# Patient Record
Sex: Female | Born: 1955 | Race: White | Hispanic: No | Marital: Single | Smoking: Never smoker
Health system: Southern US, Community
[De-identification: ages and names within clinical notes are randomized; demographics above are authoritative.]

## PROBLEM LIST (undated history)

## (undated) DIAGNOSIS — E785 Hyperlipidemia, unspecified: Secondary | ICD-10-CM

## (undated) DIAGNOSIS — I10 Essential (primary) hypertension: Secondary | ICD-10-CM

## (undated) DIAGNOSIS — Z8679 Personal history of other diseases of the circulatory system: Secondary | ICD-10-CM

## (undated) HISTORY — DX: Personal history of other diseases of the circulatory system: Z86.79

## (undated) HISTORY — DX: Hyperlipidemia, unspecified: E78.5

## (undated) HISTORY — DX: Essential (primary) hypertension: I10

---

## 1998-10-23 ENCOUNTER — Ambulatory Visit (HOSPITAL_COMMUNITY): Admission: RE | Admit: 1998-10-23 | Discharge: 1998-10-23 | Payer: Self-pay | Admitting: Family Medicine

## 2002-09-12 ENCOUNTER — Inpatient Hospital Stay (HOSPITAL_COMMUNITY): Admission: AD | Admit: 2002-09-12 | Discharge: 2002-09-12 | Payer: Self-pay | Admitting: Obstetrics and Gynecology

## 2004-01-24 ENCOUNTER — Encounter: Admission: RE | Admit: 2004-01-24 | Discharge: 2004-01-24 | Payer: Self-pay | Admitting: Obstetrics and Gynecology

## 2004-02-06 ENCOUNTER — Ambulatory Visit (HOSPITAL_COMMUNITY): Admission: RE | Admit: 2004-02-06 | Discharge: 2004-02-06 | Payer: Self-pay | Admitting: Obstetrics and Gynecology

## 2004-02-14 ENCOUNTER — Encounter: Admission: RE | Admit: 2004-02-14 | Discharge: 2004-02-14 | Payer: Self-pay | Admitting: Obstetrics and Gynecology

## 2004-02-21 ENCOUNTER — Encounter: Admission: RE | Admit: 2004-02-21 | Discharge: 2004-02-21 | Payer: Self-pay | Admitting: Obstetrics and Gynecology

## 2004-03-01 ENCOUNTER — Encounter: Admission: RE | Admit: 2004-03-01 | Discharge: 2004-03-01 | Payer: Self-pay | Admitting: Family Medicine

## 2004-03-13 ENCOUNTER — Encounter: Admission: RE | Admit: 2004-03-13 | Discharge: 2004-03-13 | Payer: Self-pay | Admitting: Obstetrics and Gynecology

## 2015-06-06 ENCOUNTER — Telehealth: Payer: Self-pay | Admitting: Cardiovascular Disease

## 2015-06-06 NOTE — Telephone Encounter (Signed)
Received records from [facility] for appointment on 07/07/15 with Dr Allyson SabalBerry.  [facility] for Dr Hazle CocaBerry's schedule on 07/07/15.

## 2015-07-07 ENCOUNTER — Encounter: Payer: Self-pay | Admitting: Cardiovascular Disease

## 2015-07-07 ENCOUNTER — Ambulatory Visit (INDEPENDENT_AMBULATORY_CARE_PROVIDER_SITE_OTHER): Payer: Self-pay | Admitting: Cardiovascular Disease

## 2015-07-07 VITALS — BP 144/92 | HR 64 | Ht 62.0 in | Wt 176.0 lb

## 2015-07-07 DIAGNOSIS — E785 Hyperlipidemia, unspecified: Secondary | ICD-10-CM | POA: Insufficient documentation

## 2015-07-07 DIAGNOSIS — R0989 Other specified symptoms and signs involving the circulatory and respiratory systems: Secondary | ICD-10-CM

## 2015-07-07 DIAGNOSIS — R011 Cardiac murmur, unspecified: Secondary | ICD-10-CM | POA: Insufficient documentation

## 2015-07-07 DIAGNOSIS — I10 Essential (primary) hypertension: Secondary | ICD-10-CM | POA: Insufficient documentation

## 2015-07-07 NOTE — Assessment & Plan Note (Signed)
History of cardiac murmur in the past. 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 We will check a 2-D echocardiogram

## 2015-07-07 NOTE — Progress Notes (Signed)
     07/07/2015 Nicole Mack   1956/03/09  [id number]  Primary Physician No primary care provider on file. Primary Cardiologist: Runell GessJonathan J. Eddy Termine MD Roseanne RenoFACP,FACC,FAHA, FSCAI   HPI:  Nicole Mack is a 59 year old moderately overweight single Caucasian female mother of 2, grandmother and 3 grandchildren who is referred by her primary care physician for evaluation of a murmur and hypertension. She has no prior cardiac history. Risk factors include treated hypertension, hyperlipidemia and family history. She does not smoke. He's never had a heart attack or stroke. She denies chest pain or shortness of breath. She does admit to dietary indiscretion.   Current Outpatient Prescriptions  Medication Sig Dispense Refill  . aspirin 81 MG EC tablet Take 81 mg by mouth every other day.     . carvedilol (COREG) 12.5 MG tablet Take 12.5 mg by mouth 2 (two) times daily. TAKE 1 AND 1/2 TABLET TWICE DAILY.    . hydrALAZINE (APRESOLINE) 25 MG tablet Take 25 mg by mouth 2 (two) times daily.    Marland Kitchen. lisinopril (PRINIVIL,ZESTRIL) 20 MG tablet Take 20 mg by mouth 2 (two) times daily.     No current facility-administered medications for this visit.    No Known Allergies  Social History   Social History  . Marital Status: Divorced    Spouse Name: N/A  . Number of Children: N/A  . Years of Education: N/A   Occupational History  . Not on file.   Social History Main Topics  . Smoking status: Never Smoker   . Smokeless tobacco: Never Used  . Alcohol Use: No  . Drug Use: Not on file  . Sexual Activity: Not on file   Other Topics Concern  . Not on file   Social History Narrative  . No narrative on file     Review of Systems: General: negative for chills, fever, night sweats or weight changes.  Cardiovascular: negative for chest pain, dyspnea on exertion, edema, orthopnea, palpitations, paroxysmal nocturnal dyspnea or shortness of breath Dermatological: negative for rash Respiratory: negative for cough or  wheezing Urologic: negative for hematuria Abdominal: negative for nausea, vomiting, diarrhea, bright red blood per rectum, melena, or hematemesis Neurologic: negative for visual changes, syncope, or dizziness All other systems reviewed and are otherwise negative except as noted above.    Blood pressure 144/92, pulse 64, height 5\' 2"  (1.575 m), weight 176 lb (79.833 kg).  General appearance: alert and no distress Neck: no adenopathy, no JVD, supple, symmetrical, trachea midline, thyroid not enlarged, symmetric, no tenderness/mass/nodules and soft right carotid bruit Lungs: clear to auscultation bilaterally Heart: 2/6 outflow tract murmur consistent with aortic sclerosis Extremities: extremities normal, atraumatic, no cyanosis or edema  EKG /64 with left bundle branch block. I personally reviewed this EKG  ASSESSMENT AND PLAN:   Hyperlipidemia History of hyperlipidemia currently not on statin therapy per patient with dietary indiscretion. She has had high triglycerides in the past. We will recheck a lipid and liver profile  Essential hypertension history of hypertension with blood pressure measured today at 144/92. Her blood pressure measurements at home are much better than this. She is on carvedilol, hydralazine and lisinopril. Continue current meds at current dosing. She is aware of soft restriction.  Cardiac murmur History of cardiac murmur in the past. 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 We will check a 2-D echocardiogram      Runell GessJonathan J. Tywone Bembenek MD [facility] CenterFACP,FACC,FAHA, [facility] CenterFSCAI 07/07/2015 12:21 PM

## 2015-07-07 NOTE — Patient Instructions (Signed)
Medication Instructions:  Your physician recommends that you continue on your current medications as directed. Please refer to the Current Medication list given to you today.   Labwork: Your physician recommends that you return for lab work in: FASTING (lipid/liver) The lab can be found on the FIRST FLOOR of out building in [address]   Testing/Procedures: Your physician has requested that you have a carotid duplex. This test is an ultrasound of the carotid arteries in your neck. It looks at blood flow through these arteries that supply the brain with blood. Allow one hour for this exam. There are no restrictions or special instructions.  Your physician has requested that you have an echocardiogram. Echocardiography is a painless test that uses sound waves to create images of your heart. It provides your doctor with information about the size and shape of your heart and how well your heart's chambers and valves are working. This procedure takes approximately one hour. There are no restrictions for this procedure.    Follow-Up: Follow up with Dr. Allyson SabalBerry as needed.   Any Other Special Instructions Will Be Listed Below (If Applicable).     If you need a refill on your cardiac medications before your next appointment, please call your pharmacy.

## 2015-07-07 NOTE — Assessment & Plan Note (Signed)
History of hyperlipidemia currently not on statin therapy per patient with dietary indiscretion. She has had high triglycerides in the past. We will recheck a lipid and liver profile

## 2015-07-07 NOTE — Assessment & Plan Note (Addendum)
history of hypertension with blood pressure measured today at 144/92. Her blood pressure measurements at home are much better than this. She is on carvedilol, hydralazine and lisinopril. Continue current meds at current dosing. She is aware of soft restriction.

## 2015-07-27 ENCOUNTER — Telehealth: Payer: Self-pay | Admitting: Cardiovascular Disease

## 2015-07-27 NOTE — Telephone Encounter (Signed)
Notified pt that [facility] that was scanned at her new pt appt w/Dr. Nanetta BattyJonathan Berry was only valid with [facility].  She was not aware of this at the time of this appt and she was not informed of this at check in.  Pt given [facility] Billing# to obtain a [facility] financial aid form or will pick up a form at our front desk.  Pt [facility] and if obtains [facility] aid she would like to continue to see Dr. Allyson SabalBerry.  Will keep her upcoming 08-14-15 echo and carotid appt for now but will cancel if not financial aid is not obtained in time.  She is not sure of exact date her [facility] aid expires but she knows it is soon.

## 2015-08-14 ENCOUNTER — Inpatient Hospital Stay (HOSPITAL_COMMUNITY): Admission: RE | Admit: 2015-08-14 | Payer: Self-pay | Source: Ambulatory Visit

## 2015-08-28 ENCOUNTER — Other Ambulatory Visit (HOSPITAL_COMMUNITY): Payer: Self-pay

## 2015-09-25 ENCOUNTER — Other Ambulatory Visit (HOSPITAL_COMMUNITY): Payer: Self-pay

## 2015-10-23 ENCOUNTER — Inpatient Hospital Stay (HOSPITAL_COMMUNITY): Admission: RE | Admit: 2015-10-23 | Payer: Self-pay | Source: Ambulatory Visit

## 2020-09-15 ENCOUNTER — Emergency Department (INDEPENDENT_AMBULATORY_CARE_PROVIDER_SITE_OTHER): Payer: Medicare HMO

## 2020-09-15 ENCOUNTER — Other Ambulatory Visit: Payer: Self-pay

## 2020-09-15 ENCOUNTER — Emergency Department
Admission: EM | Admit: 2020-09-15 | Discharge: 2020-09-15 | Disposition: A | Discharge status: Home / Self Care | Payer: Medicare HMO | Source: Home / Self Care

## 2020-09-15 ENCOUNTER — Encounter: Payer: Self-pay | Admitting: Emergency Medicine

## 2020-09-15 DIAGNOSIS — M25561 Pain in right knee: Secondary | ICD-10-CM

## 2020-09-15 DIAGNOSIS — M7989 Other specified soft tissue disorders: Secondary | ICD-10-CM | POA: Diagnosis not present

## 2020-09-15 NOTE — ED Triage Notes (Signed)
Patient here for evaluation of small lump that appeared on right knee 3-4 days ago; no known injury; no pain. Has not had covid nor influenza vaccinations.

## 2020-09-15 NOTE — Discharge Instructions (Signed)
Wear a compression knee brace or wrap with an ACE wrap daily over the course of the next 5 days. If nodule increases in diameter or is accompanied by pain, contact Dr. Jordan Likes for further work-up and evaluation. No foreign bodies or fractures related to prior fall seen on x-ray.

## 2022-03-31 IMAGING — DX DG KNEE COMPLETE 4+V*R*
4 series · 4 of 4 positions shown · non-contrast
Comparison: None.

CLINICAL DATA: 64-year-old female with fall and trauma to the right
knee.

EXAM:
RIGHT KNEE - COMPLETE 4+ VIEW

[knee ap]
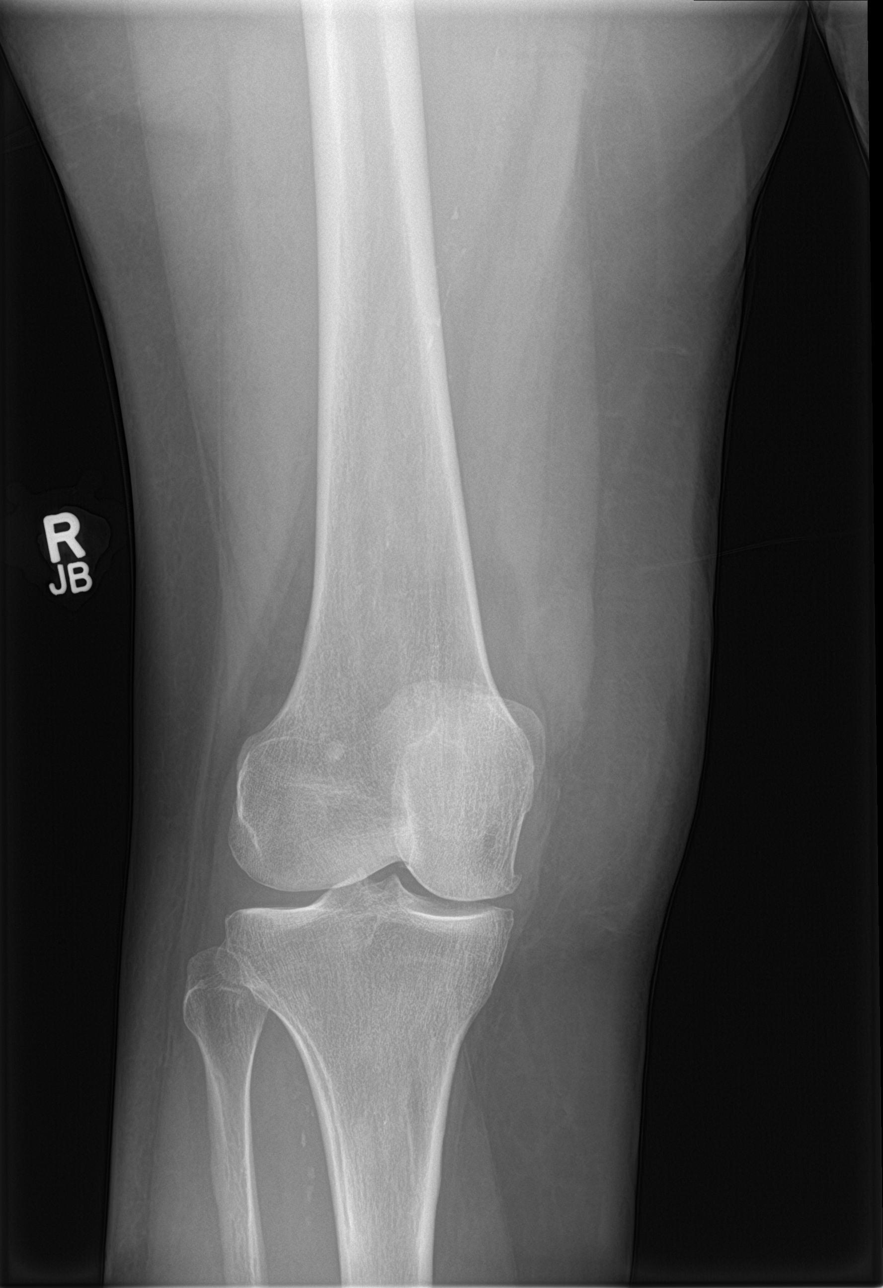

[knee lat]
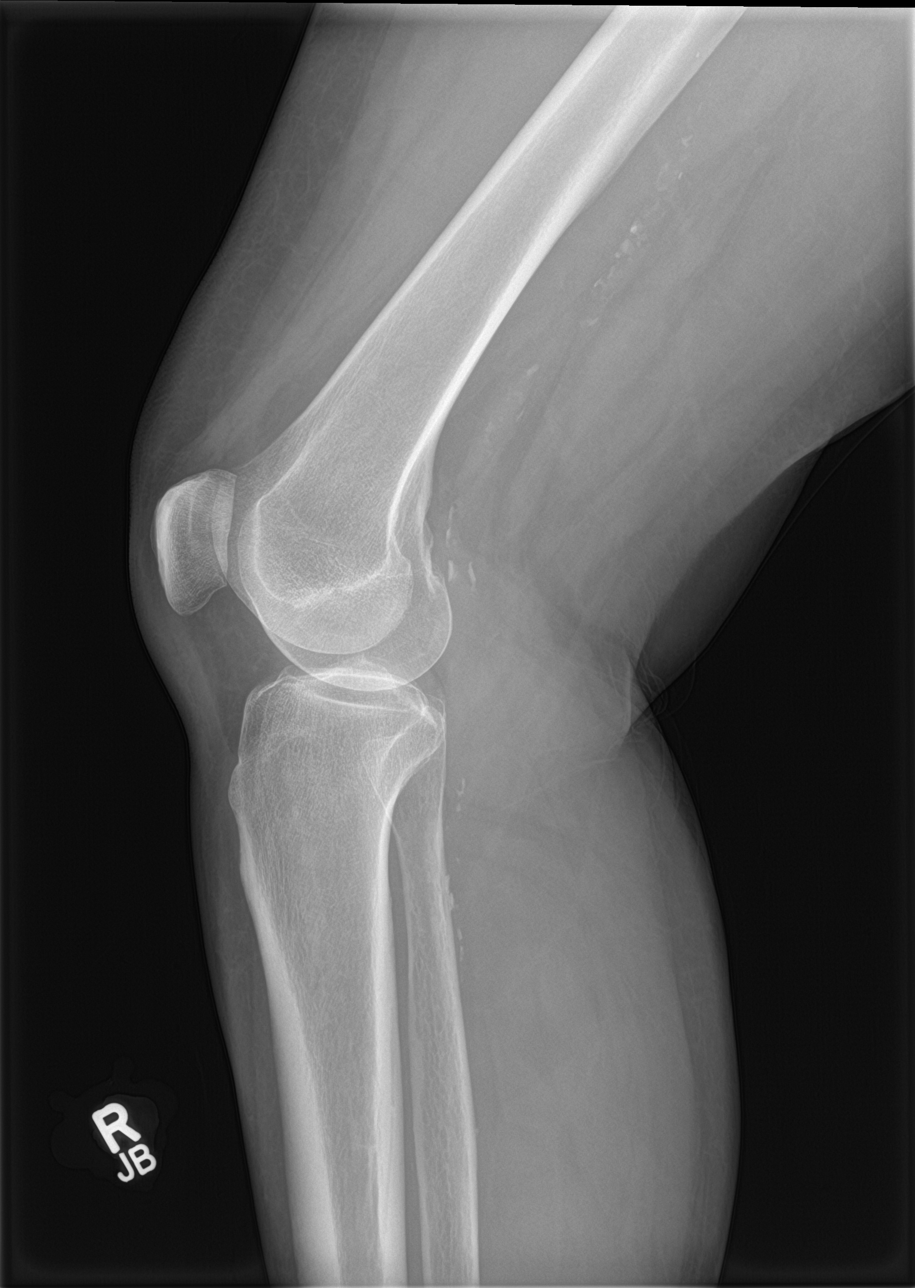

[knee obl (1 of 2)]
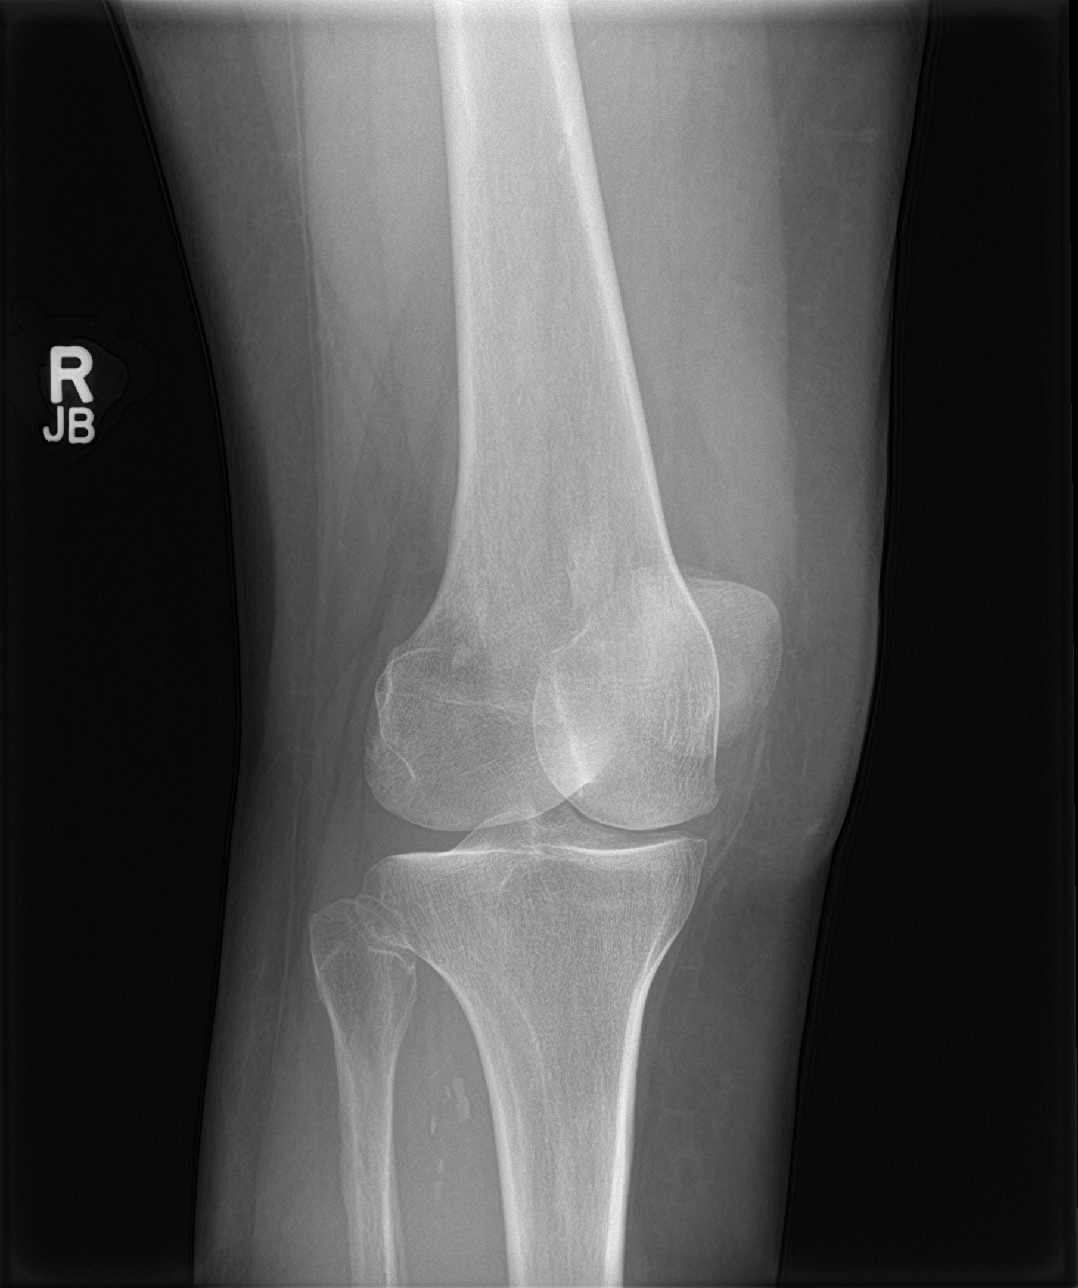

[knee obl (2 of 2)]
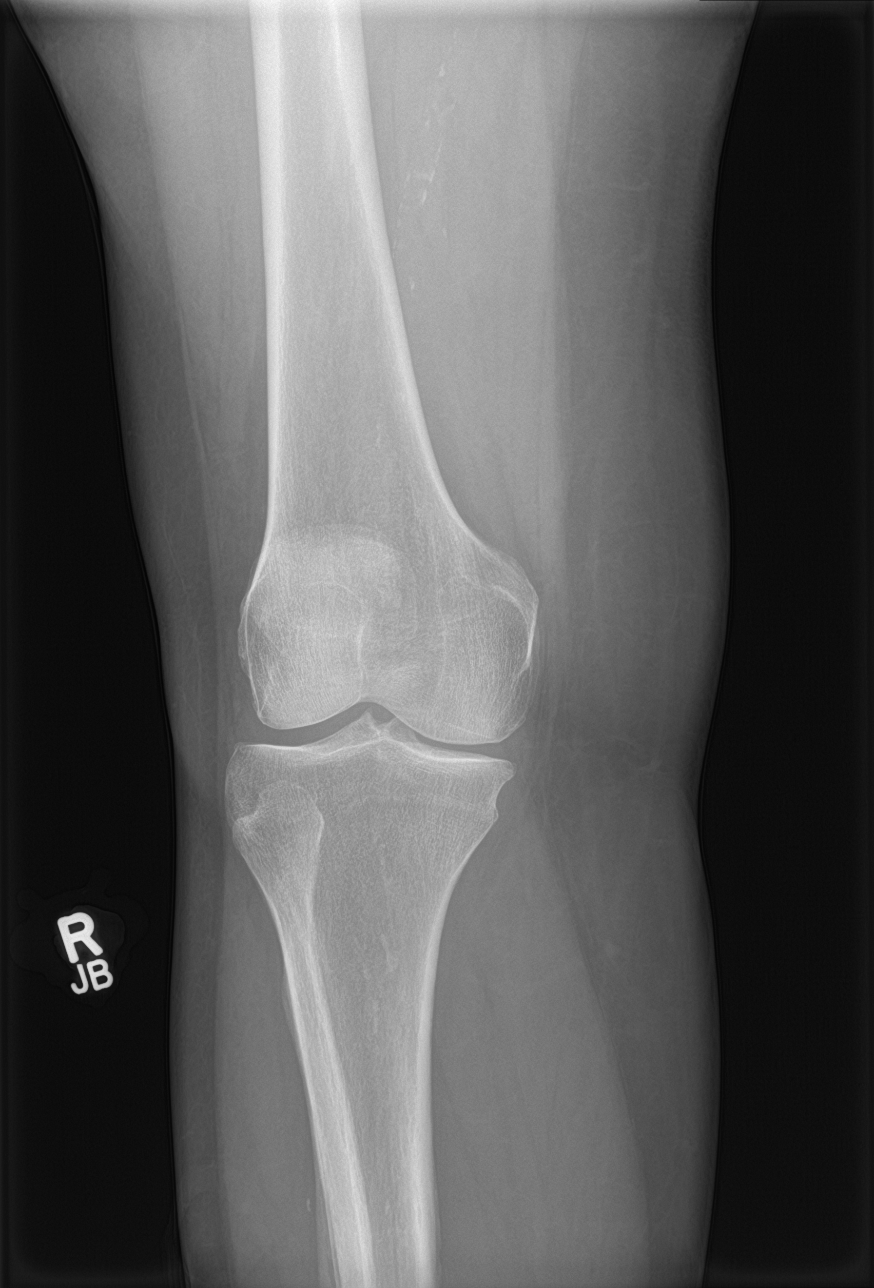

[4 of 4 positions shown; findings below may reference images not displayed]

FINDINGS: There is no acute fracture or dislocation. Mild arthritic changes
with mild narrowing of the medial compartment and spurring. No joint
effusion. The soft tissues are unremarkable. Vascular
calcifications.
IMPRESSION: No acute fracture or dislocation.

## 2022-11-16 ENCOUNTER — Other Ambulatory Visit: Payer: Self-pay

## 2022-11-16 ENCOUNTER — Emergency Department (HOSPITAL_COMMUNITY)
Admission: EM | Admit: 2022-11-16 | Discharge: 2022-11-16 | Disposition: A | Discharge status: Home / Self Care | Payer: Medicare HMO | Attending: Emergency Medicine | Admitting: Emergency Medicine

## 2022-11-16 DIAGNOSIS — H1033 Unspecified acute conjunctivitis, bilateral: Secondary | ICD-10-CM | POA: Diagnosis present

## 2022-11-16 DIAGNOSIS — I10 Essential (primary) hypertension: Secondary | ICD-10-CM | POA: Diagnosis not present

## 2022-11-16 MED ORDER — POLYMYXIN B-TRIMETHOPRIM 10000-0.1 UNIT/ML-% OP SOLN
1.0000 [drp] | OPHTHALMIC | Status: DC
  Administered 2022-11-16: 1 [drp] via OPHTHALMIC
  Filled 2022-11-16: qty 10

## 2022-11-16 MED ORDER — POLYMYXIN B-TRIMETHOPRIM 10000-0.1 UNIT/ML-% OP SOLN: 1.0000 [drp] | OPHTHALMIC | 0 refills | Status: AC

## 2022-11-16 NOTE — ED Notes (Signed)
Patient verbalizes understanding of discharge instructions. Opportunity for questioning and answers were provided. Armband removed by staff, pt discharged from ED. Ambulated out to lobby with son °

## 2022-11-16 NOTE — Discharge Instructions (Addendum)
You were seen today for drainage from your bilateral eyes.  This could be viral versus bacterial conjunctivitis.  Given the purulent nature of the drainage, you will be given antibiotic drops.  Make sure that you are washing your hands and trying not to touch her eyes.  If not improving on Monday, see your primary physician.  Your blood pressure was noted to be elevated but came down on recheck.  Make sure that you are taking your blood pressure medications as prescribed.

## 2022-11-16 NOTE — ED Triage Notes (Signed)
Patient coming to ED for evaluation of redness and drainage to bilateral eyes.  Reports recent cold.  No reports of injury.

## 2022-11-16 NOTE — ED Provider Notes (Signed)
[name] [facility] AT [facility] Provider Note   CSN: [id number] Arrival date & time: 11/16/22  0037     History  Chief Complaint  Patient presents with   Conjunctivitis    Nicole Mack is a 67 y.o. female.  HPI     This is a 67 year old female with a history of hypertension who presents with bilateral eye drainage and redness.  Patient reports that she has been sick with upper respiratory symptoms for approximately 1 week.  She had a cough that is improving.  She has had some sinus pressure.  No fevers.  Over the last several days she developed eye redness and drainage.  She states that the drainage is yellow in appearance.  No vision changes.  She has been taking some over-the-counter medications with minimal relief.  She denies shortness of breath, chest pain, abdominal pain, nausea, vomiting, diarrhea.  She was notably hypertensive upon arrival.  States that she gets this way when she goes to the doctor.  She reports that she has been taking her blood pressure medications.  Home Medications Prior to Admission medications   Medication Sig Start Date End Date Taking? Authorizing Provider  trimethoprim-polymyxin b (POLYTRIM) ophthalmic solution Place 1 drop into both eyes every 4 (four) hours. 11/16/22  Yes Angalina Ante, Mayer Masker, MD  aspirin 81 MG EC tablet Take 81 mg by mouth every other day.     [provider]  carvedilol (COREG) 12.5 MG tablet Take 12.5 mg by mouth 2 (two) times daily. TAKE 1 AND 1/2 TABLET TWICE DAILY. 07/05/15   [provider]  hydrALAZINE (APRESOLINE) 25 MG tablet Take 25 mg by mouth 2 (two) times daily. 10/19/14 10/19/19  [provider]  lisinopril (PRINIVIL,ZESTRIL) 20 MG tablet Take 20 mg by mouth 2 (two) times daily. 06/30/15   [provider]      Allergies    Penicillins and Hctz [hydrochlorothiazide]    Review of Systems   Review of Systems  Constitutional:  Negative for fever.  Eyes:   Positive for discharge and redness. Negative for visual disturbance.  Respiratory:  Positive for cough. Negative for shortness of breath.   Cardiovascular:  Negative for chest pain.  Gastrointestinal:  Negative for abdominal pain.  All other systems reviewed and are negative.   Physical Exam Updated Vital Signs BP (!) 167/95 (BP Location: Left Arm)   Pulse 82   Temp 98.2 F (36.8 C) (Oral)   Resp 18   Ht 1.575 m (5\' 2" )   Wt 77.1 kg   SpO2 91%   BMI 31.09 kg/m  Physical Exam Vitals and nursing note reviewed.  Constitutional:      Appearance: She is well-developed. She is obese. She is not ill-appearing.  HENT:     Head: Normocephalic and atraumatic.     Nose: Nose normal.     Mouth/Throat:     Mouth: Mucous membranes are moist.  Eyes:     General:        Right eye: Discharge present.        Left eye: Discharge present.    Pupils: Pupils are equal, round, and reactive to light.     Comments: Pupils equal round and reactive, bilateral conjunctive injected with purulent drainage noted, extraocular movements intact  Cardiovascular:     Rate and Rhythm: Normal rate and regular rhythm.     Heart sounds: Normal heart sounds.  Pulmonary:     Effort: Pulmonary effort is normal. No  respiratory distress.     Breath sounds: No wheezing.  Abdominal:     Palpations: Abdomen is soft.  Musculoskeletal:     Cervical back: Neck supple.  Skin:    General: Skin is warm and dry.  Neurological:     Mental Status: She is alert and oriented to person, place, and time.  Psychiatric:        Mood and Affect: Mood normal.     ED Results / Procedures / Treatments   Labs (all labs ordered are listed, but only abnormal results are displayed) Labs Reviewed - No data to display  EKG None  Radiology No results found.  Procedures Procedures    Medications Ordered in ED Medications  trimethoprim-polymyxin b (POLYTRIM) ophthalmic solution 1 drop (1 drop Both Eyes Given 11/16/22 0127)     ED Course/ Medical Decision Making/ A&P                             Medical Decision Making Risk Prescription drug management.   This patient presents to the ED for concern of bilateral eye drainage and redness, this involves an extensive number of treatment options, and is a complaint that carries with it a high risk of complications and morbidity.  I considered the following differential and admission for this acute, potentially life threatening condition.  The differential diagnosis includes viral conjunctivitis, bacterial conjunctivitis, allergic conjunctivitis, upper respiratory virus, pneumonia  MDM:    This is a 67 year old female who presents with bilateral eye redness and drainage.  Has had some upper respiratory symptoms in the last week.  She is overall nontoxic.  Initial blood pressure was 229/92.  Repeat was 167/95 without intervention.  She reports compliance with her blood pressure medications.  She has significant evidence of conjunctivitis bilaterally with purulent drainage.  Given her recent symptoms, I would favor viral infection, specifically adenovirus; however, given purulent drainage would cover for bacterial.  She was ordered Polytrim.  Breath sounds are clear.  She otherwise has no complaints.  Do not feel she needs further workup at this time.  (Labs, imaging, consults)  Labs: I Ordered, and personally interpreted labs.  The pertinent results include: None  Imaging Studies ordered: I ordered imaging studies including none I independently visualized and interpreted imaging. I agree with the radiologist interpretation  Additional history obtained from chart review.  External records from outside source obtained and reviewed including prior evaluations  Cardiac Monitoring: The patient was not maintained on a cardiac monitor.  If on the cardiac monitor, I personally viewed and interpreted the cardiac monitored which showed an underlying rhythm of:  N/A  Reevaluation: After the interventions noted above, I reevaluated the patient and found that they have :stayed the same  Social Determinants of Health:  lives independently  Disposition: Discharge  Co morbidities that complicate the patient evaluation  Past Medical History:  Diagnosis Date   History of cardiac murmur    Hyperlipidemia    Hypertension      Medicines Meds ordered this encounter  Medications   trimethoprim-polymyxin b (POLYTRIM) ophthalmic solution 1 drop   trimethoprim-polymyxin b (POLYTRIM) ophthalmic solution    Sig: Place 1 drop into both eyes every 4 (four) hours.    Dispense:  10 mL    Refill:  0    I have reviewed the patients home medicines and have made adjustments as needed  Problem List / ED Course: Problem List Items Addressed This Visit  None Visit Diagnoses     Acute bacterial conjunctivitis of both eyes    -  Primary                   Final Clinical Impression(s) / ED Diagnoses Final diagnoses:  Acute bacterial conjunctivitis of both eyes    Rx / DC Orders ED Discharge Orders          Ordered    trimethoprim-polymyxin b (POLYTRIM) ophthalmic solution  Every 4 hours        11/16/22 0154              Glendon Dunwoody, Mayer Masker, MD 11/16/22 0201
